# Patient Record
Sex: Female | Born: 1960 | Race: White | Hispanic: No | Marital: Married | Smoking: Never smoker
Health system: Southern US, Community
[De-identification: ages and names within clinical notes are randomized; demographics above are authoritative.]

---

## 1998-02-20 ENCOUNTER — Other Ambulatory Visit: Admission: RE | Admit: 1998-02-20 | Discharge: 1998-02-20 | Payer: Self-pay | Admitting: Obstetrics and Gynecology

## 1999-05-14 ENCOUNTER — Other Ambulatory Visit: Admission: RE | Admit: 1999-05-14 | Discharge: 1999-05-14 | Payer: Self-pay | Admitting: Obstetrics and Gynecology

## 2000-05-13 ENCOUNTER — Other Ambulatory Visit: Admission: RE | Admit: 2000-05-13 | Discharge: 2000-05-13 | Payer: Self-pay | Admitting: Obstetrics and Gynecology

## 2001-05-17 ENCOUNTER — Other Ambulatory Visit: Admission: RE | Admit: 2001-05-17 | Discharge: 2001-05-17 | Payer: Self-pay | Admitting: Obstetrics and Gynecology

## 2002-06-09 ENCOUNTER — Other Ambulatory Visit: Admission: RE | Admit: 2002-06-09 | Discharge: 2002-06-09 | Payer: Self-pay | Admitting: Obstetrics and Gynecology

## 2003-06-21 ENCOUNTER — Other Ambulatory Visit: Admission: RE | Admit: 2003-06-21 | Discharge: 2003-06-21 | Payer: Self-pay | Admitting: Obstetrics and Gynecology

## 2004-07-18 ENCOUNTER — Other Ambulatory Visit: Admission: RE | Admit: 2004-07-18 | Discharge: 2004-07-18 | Payer: Self-pay | Admitting: Obstetrics and Gynecology

## 2005-11-03 ENCOUNTER — Other Ambulatory Visit: Admission: RE | Admit: 2005-11-03 | Discharge: 2005-11-03 | Payer: Self-pay | Admitting: Obstetrics and Gynecology

## 2007-12-28 ENCOUNTER — Encounter: Admission: RE | Admit: 2007-12-28 | Discharge: 2007-12-28 | Payer: Self-pay | Admitting: Obstetrics and Gynecology

## 2010-10-05 ENCOUNTER — Encounter: Payer: Self-pay | Admitting: Obstetrics and Gynecology

## 2018-09-23 ENCOUNTER — Encounter (INDEPENDENT_AMBULATORY_CARE_PROVIDER_SITE_OTHER): Payer: Self-pay | Admitting: Orthopaedic Surgery

## 2018-09-23 ENCOUNTER — Telehealth (INDEPENDENT_AMBULATORY_CARE_PROVIDER_SITE_OTHER): Payer: Self-pay | Admitting: Orthopaedic Surgery

## 2018-09-23 ENCOUNTER — Ambulatory Visit (INDEPENDENT_AMBULATORY_CARE_PROVIDER_SITE_OTHER): Admitting: Orthopaedic Surgery

## 2018-09-23 DIAGNOSIS — G8929 Other chronic pain: Secondary | ICD-10-CM | POA: Diagnosis not present

## 2018-09-23 DIAGNOSIS — M25512 Pain in left shoulder: Secondary | ICD-10-CM | POA: Diagnosis not present

## 2018-09-23 MED ORDER — HYDROCODONE-ACETAMINOPHEN 5-325 MG PO TABS: 1.0000 | ORAL_TABLET | Freq: Four times a day (QID) | ORAL | 0 refills | Status: AC | PRN

## 2018-09-23 NOTE — Progress Notes (Signed)
   Office Visit Note   Patient: Shannon Wiley           Date of Birth: 11/21/1960           MRN: [id number] Visit Date: 09/23/2018              Requested by: Richmond Campbell., PA-C [address] [place] [address] PCP: System, Pcp Not In   Assessment & Plan: Visit Diagnoses:  1. 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  Patient has not received any conservative treatment up to this point.  Therefore we will arrange for a glenohumeral injection with Dr. Prince Rome and at least 6 weeks of physical therapy and rest from tennis.  I would like to recheck her in 8 weeks.  Follow-Up Instructions: Return in about 2 months (around 11/22/2018).   Orders:  No orders of the defined types were placed in this encounter.  No orders of the defined types were placed in this encounter.     Procedures: No procedures performed   Clinical Data: No additional findings.   Subjective: Chief Complaint  Patient presents with  . 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  This started this past summer without any known injury or change in activity.  It is progressively worsened since Thanksgiving.  Pain she has is to the entire shoulder and occasionally radiates into the deltoid.  Pain is worse with forward flexion and external rotation of the shoulder.  She denies any weakness.  She denies numbness, tingling or burning.  No fevers or chills.  No previous cortisone injection to the shoulder.  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  Recent MRI of the left shoulder shows a partial rotator cuff tear and a SLAP tear with mild glenohumeral changes.  Review of Systems as detailed in HPI.  All others reviewed  and are negative.   Objective: Vital Signs: There were no vitals taken for this visit.  Physical Exam well-developed well-nourished female no acute distress.  Alert and oriented x3.  Ortho Exam examination of the left shoulder reveals full forward flexion and external rotation.  She does have increased pain with the extremes of both.  She can internally rotate to her back pocket.  She is most symptomatic with empty can testing.  Moderately positive crank test.  Mildly positive O'Brien and mildly positive belly press.Marland Kitchen  Positive cross body adduction.  Negative drop arm.  She is neurovascular intact distally.  Specialty Comments:  No specialty comments available.  Imaging: No new imaging   PMFS History: Patient Active Problem List   Diagnosis Date Noted  . Chronic left shoulder pain 09/23/2018   History reviewed. No pertinent past medical history.  History reviewed. No pertinent family history.  History reviewed. No pertinent surgical history. Social History   Occupational History  . Not on file  Tobacco Use  . Smoking status: Not on file  Substance and Sexual Activity  . Alcohol use: Not on file  . Drug use: Not on file  . Sexual activity: Not on file

## 2018-09-23 NOTE — Telephone Encounter (Signed)
Patient called stating that the facility Dr. Roda ShuttersXu referred her to does not accept [employer] insurance.  Please advise patient about new referral.  CB#[phone].  Thank you.

## 2018-09-23 NOTE — Telephone Encounter (Signed)
Patient called back requesting to be referred to [facility] to their PT.  Thank you.

## 2018-09-23 NOTE — Progress Notes (Signed)
S: She is here for ultrasound-guided left glenohumeral injection.  Procedure: Left shoulder injection: After sterile prep with Betadine, injected 8 cc 1% lidocaine without epinephrine and 40 mg methylprednisolone using a 22-gauge spinal needle passing the needle into the posterior glenohumeral joint without complication.  Injectate was seen filling the joint capsule.  Follow-up as scheduled.

## 2018-09-26 ENCOUNTER — Other Ambulatory Visit (INDEPENDENT_AMBULATORY_CARE_PROVIDER_SITE_OTHER): Payer: Self-pay | Admitting: Radiology

## 2018-09-26 DIAGNOSIS — G8929 Other chronic pain: Secondary | ICD-10-CM

## 2018-09-26 DIAGNOSIS — M25512 Pain in left shoulder: Principal | ICD-10-CM

## 2018-09-26 NOTE — Telephone Encounter (Signed)
yes

## 2018-09-26 NOTE — Telephone Encounter (Signed)
Ok for referral to [facility]?

## 2018-09-26 NOTE — Telephone Encounter (Signed)
I called patient and advised referral for PT at [facility] entered into epic, will fax it to them and she can call them. LMVM

## 2018-09-27 MED ORDER — METHYLPREDNISOLONE ACETATE 40 MG/ML IJ SUSP: 40.0000 mg | Freq: Once | INTRAMUSCULAR | Status: AC

## 2018-09-27 NOTE — Addendum Note (Signed)
Addended by: Lillia Carmel on: 09/27/2018 10:03 AM   Modules accepted: Orders

## 2018-11-22 ENCOUNTER — Ambulatory Visit (INDEPENDENT_AMBULATORY_CARE_PROVIDER_SITE_OTHER): Admitting: Orthopaedic Surgery

## 2018-11-22 ENCOUNTER — Encounter (INDEPENDENT_AMBULATORY_CARE_PROVIDER_SITE_OTHER): Payer: Self-pay | Admitting: Orthopaedic Surgery

## 2018-11-22 DIAGNOSIS — M25512 Pain in left shoulder: Secondary | ICD-10-CM | POA: Insufficient documentation

## 2018-11-22 NOTE — Progress Notes (Signed)
   Office Visit Note   Patient: Shannon Wiley           Date of Birth: [birth year]           MRN: [id number] Visit Date: 11/22/2018              Requested by: No referring provider defined for this encounter. PCP: System, Pcp Not In   Assessment & Plan: Visit Diagnoses:  1. Acute pain of left shoulder     Plan: Impression is resolving left shoulder pain.  At this point, she will advance with activity as tolerated.  Follow-up with [place] as needed.  Follow-Up Instructions: Return if symptoms worsen or fail to improve.   Orders:  No orders of the defined types were placed in this encounter.  No orders of the defined types were placed in this encounter.     Procedures: No procedures performed   Clinical Data: No additional findings.   Subjective: Chief Complaint  Patient presents with  . Left Shoulder - Pain    HPI patient is a pleasant 58 year old female who presents our clinic today for follow-up of her left shoulder.  History of left shoulder partial articular surface tear supraspinatus and subscapularis tendinosis with a SLAP tear.  She had an intra-articular cortisone injection with Dr. Prince Rome on 09/23/2018 and started formal physical therapy soon after.  She has noticed significant relief in symptoms.  She has finished formal physical therapy but continues to work on her home exercise program.  She has not returned to tennis at this point.  Review of Systems as detailed in HPI.  All others reviewed and are negative.   Objective: Vital Signs: There were no vitals taken for this visit.  Physical Exam well-developed and well-nourished female in no acute distress.  Alert and oriented x3.  Ortho Exam examination left shoulder reveals flex range of motion in all planes.  She can internally rotate to T12.  She is neurovascularly intact distally.  Specialty Comments:  No specialty comments available.  Imaging: No new imaging   PMFS History: Patient Active Problem List    Diagnosis Date Noted  . Acute pain of left shoulder 11/22/2018  . Chronic left shoulder pain 09/23/2018   History reviewed. No pertinent past medical history.  History reviewed. No pertinent family history.  History reviewed. No pertinent surgical history. Social History   Occupational History  . Not on file  Tobacco Use  . Smoking status: Never Smoker  . Smokeless tobacco: Never Used  Substance and Sexual Activity  . Alcohol use: Not on file  . Drug use: Not on file  . Sexual activity: Not on file

## 2019-04-14 ENCOUNTER — Other Ambulatory Visit: Payer: Self-pay

## 2019-04-14 DIAGNOSIS — Z20822 Contact with and (suspected) exposure to covid-19: Secondary | ICD-10-CM

## 2019-04-16 LAB — NOVEL CORONAVIRUS, NAA: SARS-CoV-2, NAA: NOT DETECTED

## 2020-05-28 ENCOUNTER — Other Ambulatory Visit: Payer: Self-pay

## 2020-05-28 DIAGNOSIS — Z20822 Contact with and (suspected) exposure to covid-19: Secondary | ICD-10-CM

## 2020-05-30 LAB — SARS-COV-2, NAA 2 DAY TAT

## 2020-05-30 LAB — NOVEL CORONAVIRUS, NAA: SARS-CoV-2, NAA: NOT DETECTED

## 2020-09-05 ENCOUNTER — Other Ambulatory Visit: Payer: Self-pay

## 2021-06-11 ENCOUNTER — Other Ambulatory Visit: Payer: Self-pay | Admitting: Obstetrics and Gynecology

## 2021-06-11 DIAGNOSIS — R928 Other abnormal and inconclusive findings on diagnostic imaging of breast: Secondary | ICD-10-CM

## 2021-06-14 ENCOUNTER — Ambulatory Visit: Payer: Self-pay

## 2021-06-14 ENCOUNTER — Other Ambulatory Visit: Payer: Self-pay

## 2021-06-14 ENCOUNTER — Ambulatory Visit
Admission: RE | Admit: 2021-06-14 | Discharge: 2021-06-14 | Disposition: A | Discharge status: Home / Self Care | Source: Ambulatory Visit | Attending: Obstetrics and Gynecology | Admitting: Obstetrics and Gynecology

## 2021-06-14 DIAGNOSIS — R928 Other abnormal and inconclusive findings on diagnostic imaging of breast: Secondary | ICD-10-CM

## 2021-12-19 IMAGING — MG MM DIGITAL DIAGNOSTIC UNILAT*R* W/ TOMO W/ CAD
8 series · 9 of 24 positions shown · non-contrast
Comparison: Previous exam(s).

CLINICAL DATA: 60-year-old female presenting as a recall from
screening for possible right breast asymmetry.

EXAM:
DIGITAL DIAGNOSTIC UNILATERAL RIGHT MAMMOGRAM WITH TOMOSYNTHESIS AND
CAD
TECHNIQUE: Right digital diagnostic mammography and breast tomosynthesis was
performed. The images were evaluated with computer-aided detection.

[R CC synth-2D (1 of 3)]
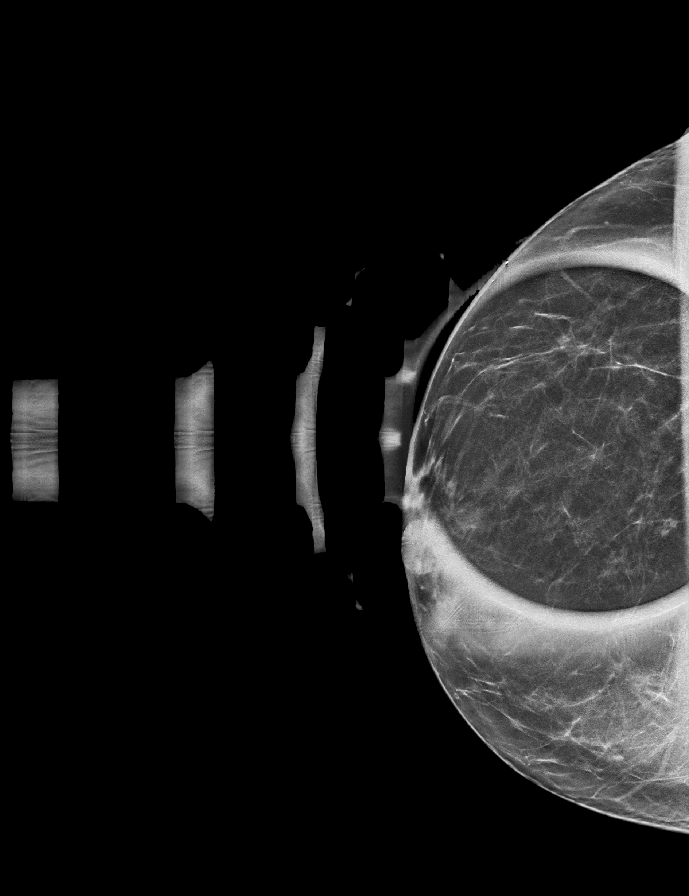

[R CC synth-2D (2 of 3)]
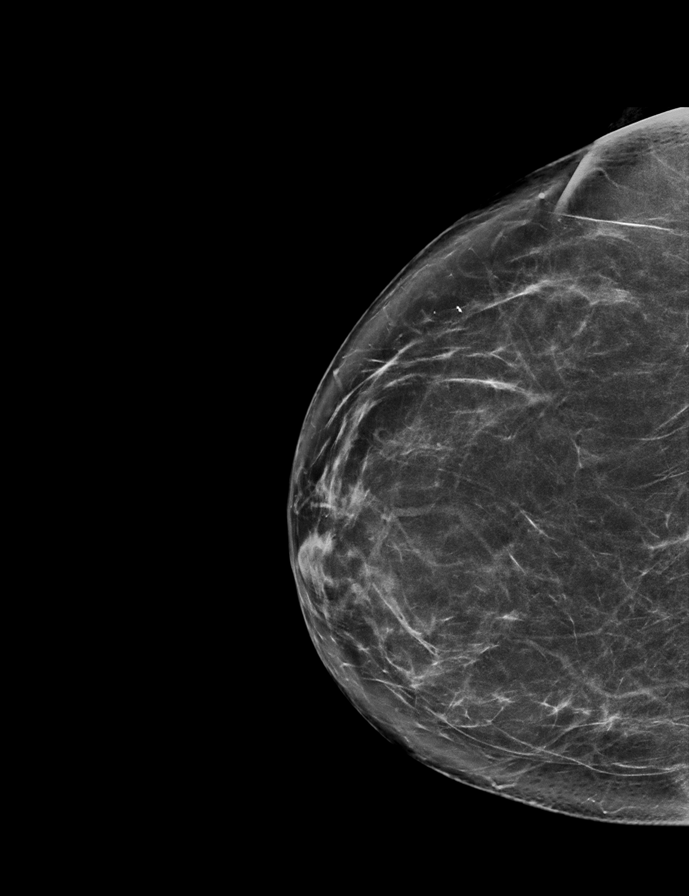

[R CC synth-2D (3 of 3)]
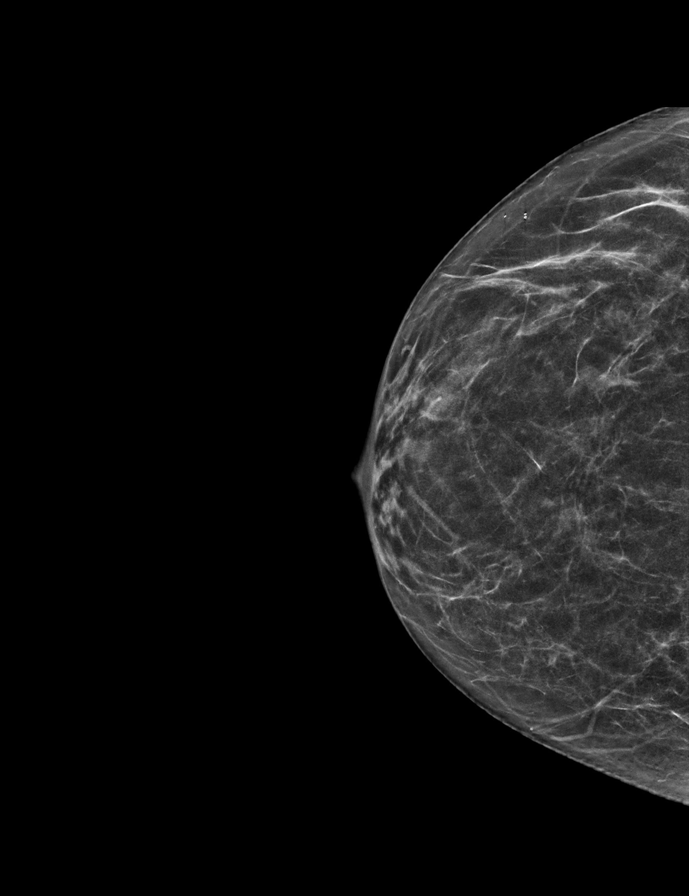

[R ML synth-2D]
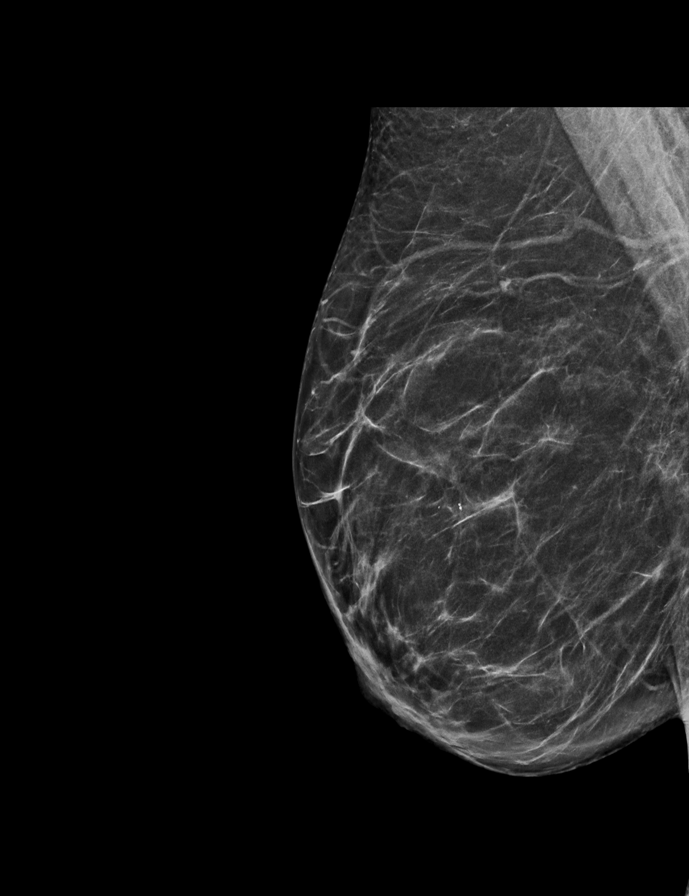

[R ML tomo · 2 of 67 frames shown]
[frame 22/67]
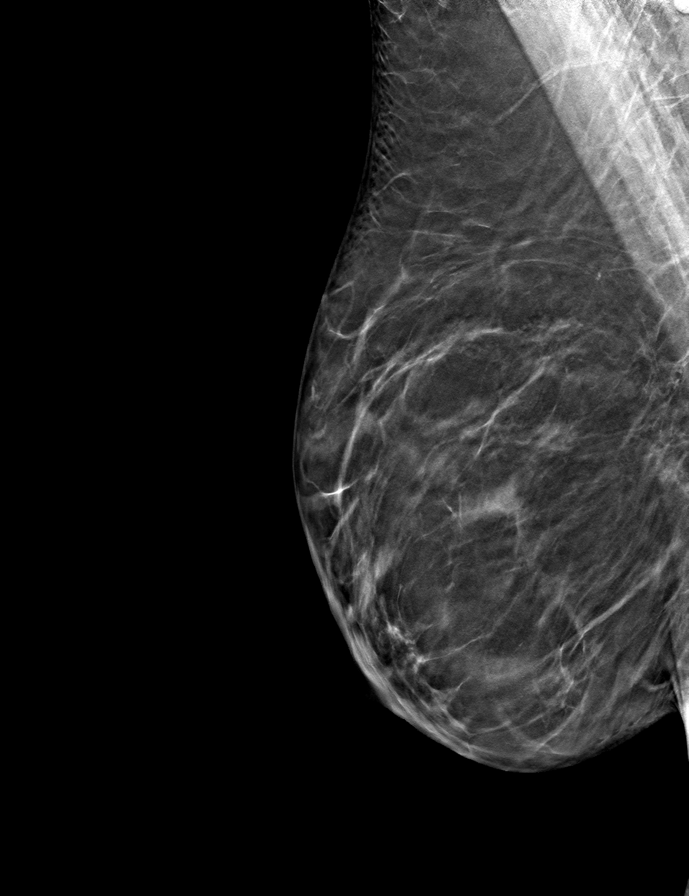
[frame 34/67]
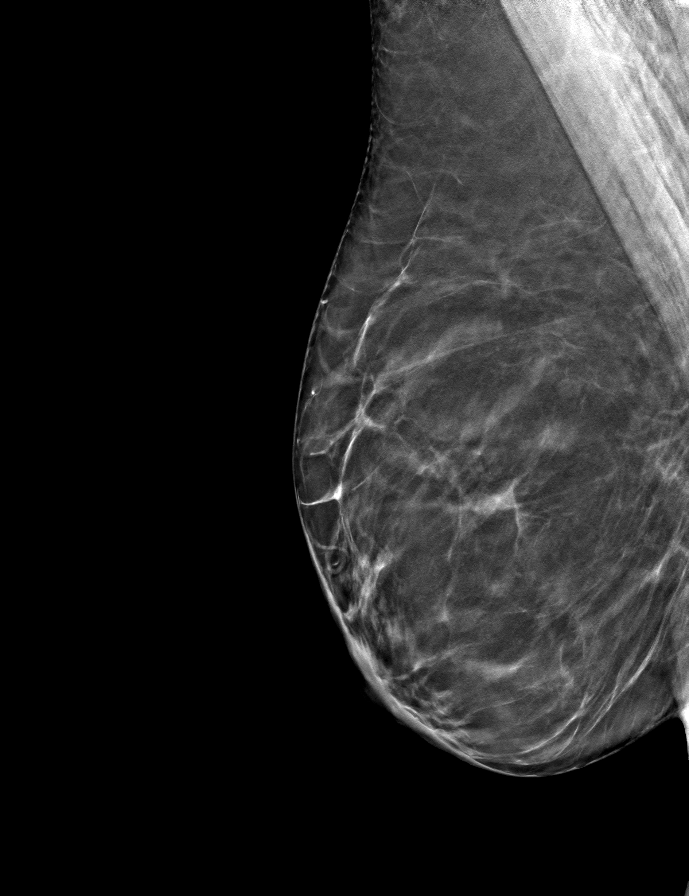

[R CC tomo (1 of 3) · tomo slice 37/72.0]
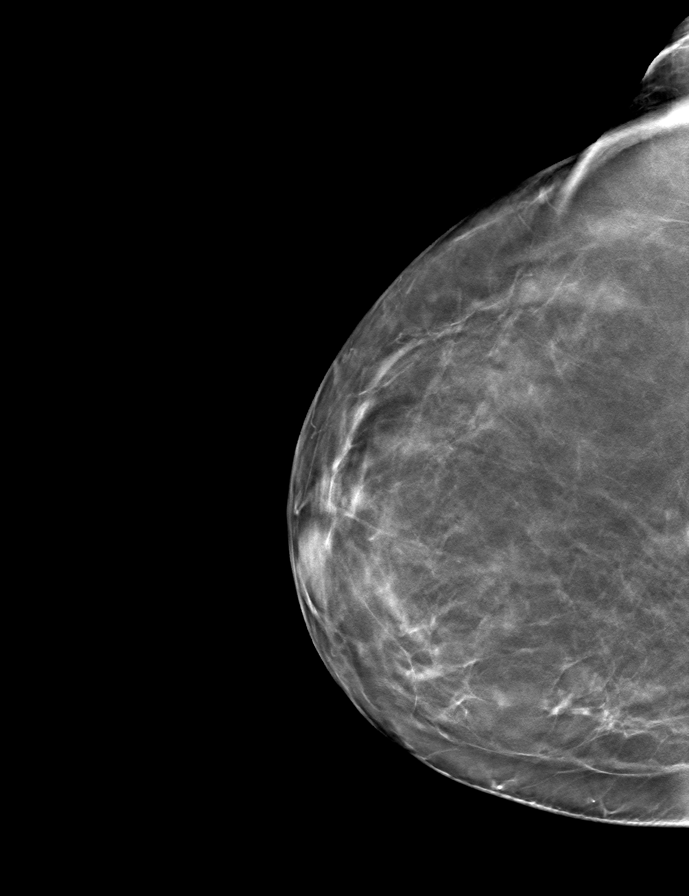

[R CC tomo (2 of 3) · tomo slice 25/49.0]
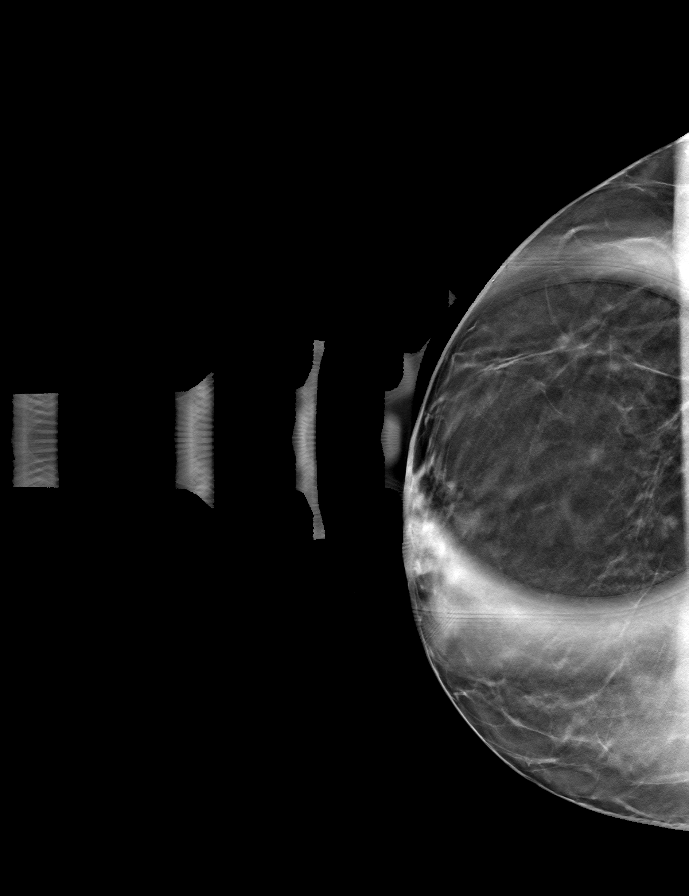

[R CC tomo (3 of 3) · tomo slice 27/53.0]
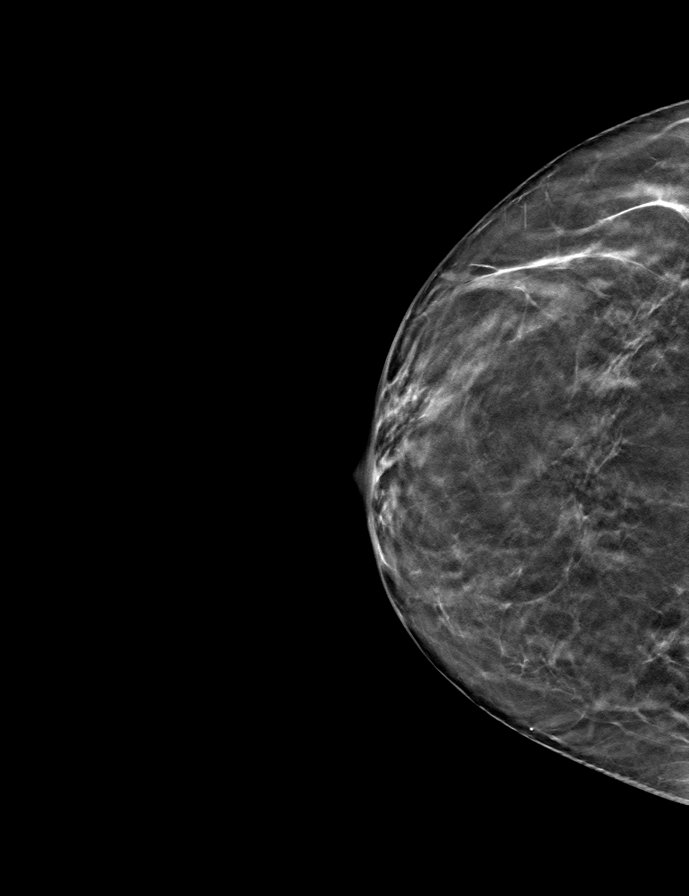

[9 of 24 positions shown; findings below may reference images not displayed]

ACR Breast Density Category b: There are scattered areas of
fibroglandular density.
FINDINGS: Full field cc and mL as well as spot compression tomosynthesis cc
views of the right breast were performed for a questioned asymmetry
in the outer right breast. On the additional imaging the tissue in
this area disperses without persistent asymmetry, mass, or
distortion. The finding on screening mammogram is most consistent
with normal fibroglandular tissue.
IMPRESSION: No mammographic evidence of malignancy in the right breast.

RECOMMENDATION:
Screening mammogram in one year.(Code:81-9-5F6)

I have discussed the findings and recommendations with the patient.
If applicable, a reminder letter will be sent to the patient
regarding the next appointment.

BI-RADS CATEGORY  1: Negative.
